# Patient Record
Sex: Male | Born: 1960 | Race: White | Hispanic: No | Marital: Single | Smoking: Never smoker
Health system: Southern US, Community
[De-identification: ages and names within clinical notes are randomized; demographics above are authoritative.]

## PROBLEM LIST (undated history)

## (undated) DIAGNOSIS — M1990 Unspecified osteoarthritis, unspecified site: Secondary | ICD-10-CM

## (undated) DIAGNOSIS — I2699 Other pulmonary embolism without acute cor pulmonale: Secondary | ICD-10-CM

## (undated) DIAGNOSIS — I82409 Acute embolism and thrombosis of unspecified deep veins of unspecified lower extremity: Secondary | ICD-10-CM

## (undated) DIAGNOSIS — J189 Pneumonia, unspecified organism: Secondary | ICD-10-CM

## (undated) HISTORY — PX: CERVICAL SPINE SURGERY: SHX589

## (undated) HISTORY — PX: BACK SURGERY: SHX140

## (undated) HISTORY — PX: KNEE SURGERY: SHX244

## (undated) HISTORY — PX: APPENDECTOMY: SHX54

---

## 2013-10-04 ENCOUNTER — Emergency Department (HOSPITAL_BASED_OUTPATIENT_CLINIC_OR_DEPARTMENT_OTHER)
Admission: EM | Admit: 2013-10-04 | Discharge: 2013-10-04 | Disposition: A | Discharge status: Home / Self Care | Payer: Medicare Other | Attending: Emergency Medicine | Admitting: Emergency Medicine

## 2013-10-04 ENCOUNTER — Encounter (HOSPITAL_BASED_OUTPATIENT_CLINIC_OR_DEPARTMENT_OTHER): Payer: Self-pay | Admitting: Emergency Medicine

## 2013-10-04 DIAGNOSIS — S81851A Open bite, right lower leg, initial encounter: Secondary | ICD-10-CM

## 2013-10-04 DIAGNOSIS — Z23 Encounter for immunization: Secondary | ICD-10-CM | POA: Diagnosis not present

## 2013-10-04 DIAGNOSIS — M129 Arthropathy, unspecified: Secondary | ICD-10-CM | POA: Diagnosis not present

## 2013-10-04 DIAGNOSIS — S81809A Unspecified open wound, unspecified lower leg, initial encounter: Principal | ICD-10-CM

## 2013-10-04 DIAGNOSIS — W540XXA Bitten by dog, initial encounter: Secondary | ICD-10-CM | POA: Insufficient documentation

## 2013-10-04 DIAGNOSIS — S81009A Unspecified open wound, unspecified knee, initial encounter: Secondary | ICD-10-CM | POA: Diagnosis present

## 2013-10-04 DIAGNOSIS — Y9389 Activity, other specified: Secondary | ICD-10-CM | POA: Insufficient documentation

## 2013-10-04 DIAGNOSIS — Z79899 Other long term (current) drug therapy: Secondary | ICD-10-CM | POA: Insufficient documentation

## 2013-10-04 DIAGNOSIS — Y9289 Other specified places as the place of occurrence of the external cause: Secondary | ICD-10-CM | POA: Insufficient documentation

## 2013-10-04 DIAGNOSIS — S91009A Unspecified open wound, unspecified ankle, initial encounter: Principal | ICD-10-CM

## 2013-10-04 HISTORY — DX: Unspecified osteoarthritis, unspecified site: M19.90

## 2013-10-04 MED ORDER — AMOXICILLIN-POT CLAVULANATE 875-125 MG PO TABS
1.0000 | ORAL_TABLET | Freq: Once | ORAL | Status: AC
  Administered 2013-10-04: 1 via ORAL
  Filled 2013-10-04: qty 1

## 2013-10-04 MED ORDER — AMOXICILLIN-POT CLAVULANATE 875-125 MG PO TABS: 1.0000 | ORAL_TABLET | Freq: Two times a day (BID) | ORAL | Status: DC

## 2013-10-04 MED ORDER — TETANUS-DIPHTH-ACELL PERTUSSIS 5-2.5-18.5 LF-MCG/0.5 IM SUSP
0.5000 mL | Freq: Once | INTRAMUSCULAR | Status: AC
  Administered 2013-10-04: 0.5 mL via INTRAMUSCULAR
  Filled 2013-10-04: qty 0.5

## 2013-10-04 NOTE — Discharge Instructions (Signed)

## 2013-10-04 NOTE — ED Notes (Signed)
Animal bite to his right upper leg by his sisters dog. Rabies vaccine is UTD. Puncture wound. Pt states he needs a DT.

## 2013-10-04 NOTE — ED Provider Notes (Signed)
CSN: [id number]     Arrival date & time 10/04/13  4098 History   First MD Initiated Contact with Patient 10/04/13 1951     Chief Complaint  Patient presents with  . Animal Bite     (Consider location/radiation/quality/duration/timing/severity/associated sxs/prior Treatment) Patient is a 53 y.o. male presenting with animal bite. The history is provided by the patient. No language interpreter was used.  Animal Bite Contact animal:  Dog Location:  Leg Leg injury location:  R leg Time since incident:  1 hour Pain details:    Quality:  Aching   Severity:  No pain Incident location:  Another residence Provoked: unprovoked   Notifications:  None Animal's rabies vaccination status:  Up to date Animal in possession: yes   Tetanus status:  Out of date Worsened by:  Nothing tried Ineffective treatments:  None tried playing with dog, hit dogs mouth  Past Medical History  Diagnosis Date  . Arthritis    No past surgical history on file. No family history on file. History  Substance Use Topics  . Smoking status: Not on file  . Smokeless tobacco: Not on file  . Alcohol Use: No    Review of Systems  All other systems reviewed and are negative.     Allergies  Review of patient's allergies indicates not on file.  Home Medications   Prior to Admission medications   Medication Sig Start Date End Date Taking? Authorizing Provider  Adalimumab (HUMIRA PEN [place]) Inject into the skin.   Yes Historical Provider, MD  MORPHINE SULFATE PO Take by mouth.   Yes Historical Provider, MD   BP 116/78  Pulse 76  Temp(Src) 98 F (36.7 C) (Oral)  Resp 20  Ht  (1.88 m)  Wt 190 lb (86.183 kg)  BMI 24.38 kg/m2  SpO2 99% Physical Exam  Constitutional: He appears well-developed and well-nourished.  Musculoskeletal: He exhibits tenderness.  Neurological: He is alert.  Skin: Skin is warm.  1cm abrasion superficial laceration  nv intact  Psychiatric: He has a normal mood and affect.     ED Course  Procedures (including critical care time) Labs Review Labs Reviewed - No data to display  Imaging Review No results found.   EKG Interpretation None      MDM Pt needs td.   Pt is on humira.   I counseled on infection and close monitoring   Final diagnoses:  Dog bite of right lower leg, initial encounter    Td Aigmentin Pt reports Mother is RN and will help him look for infection    Elson Areas, PA-C 10/04/13 2002

## 2013-10-04 NOTE — ED Provider Notes (Signed)
Medical screening examination/treatment/procedure(s) were performed by non-physician practitioner and as supervising physician I was immediately available for consultation/collaboration.   EKG Interpretation None        Sohum Delillo, MD 10/04/13 2256 

## 2019-10-14 ENCOUNTER — Emergency Department (HOSPITAL_BASED_OUTPATIENT_CLINIC_OR_DEPARTMENT_OTHER)
Admission: EM | Admit: 2019-10-14 | Discharge: 2019-10-14 | Disposition: A | Discharge status: Home / Self Care | Payer: Medicare HMO | Attending: Emergency Medicine | Admitting: Emergency Medicine

## 2019-10-14 ENCOUNTER — Encounter (HOSPITAL_BASED_OUTPATIENT_CLINIC_OR_DEPARTMENT_OTHER): Payer: Self-pay

## 2019-10-14 ENCOUNTER — Telehealth (HOSPITAL_BASED_OUTPATIENT_CLINIC_OR_DEPARTMENT_OTHER): Payer: Self-pay | Admitting: Emergency Medicine

## 2019-10-14 ENCOUNTER — Other Ambulatory Visit: Payer: Self-pay

## 2019-10-14 ENCOUNTER — Emergency Department (HOSPITAL_BASED_OUTPATIENT_CLINIC_OR_DEPARTMENT_OTHER): Payer: Medicare HMO

## 2019-10-14 DIAGNOSIS — J0140 Acute pansinusitis, unspecified: Secondary | ICD-10-CM | POA: Insufficient documentation

## 2019-10-14 DIAGNOSIS — Z20822 Contact with and (suspected) exposure to covid-19: Secondary | ICD-10-CM | POA: Diagnosis not present

## 2019-10-14 DIAGNOSIS — R0981 Nasal congestion: Secondary | ICD-10-CM | POA: Insufficient documentation

## 2019-10-14 DIAGNOSIS — R05 Cough: Secondary | ICD-10-CM | POA: Diagnosis present

## 2019-10-14 HISTORY — DX: Other pulmonary embolism without acute cor pulmonale: I26.99

## 2019-10-14 HISTORY — DX: Pneumonia, unspecified organism: J18.9

## 2019-10-14 HISTORY — DX: Acute embolism and thrombosis of unspecified deep veins of unspecified lower extremity: I82.409

## 2019-10-14 LAB — SARS CORONAVIRUS 2 BY RT PCR (HOSPITAL ORDER, PERFORMED IN ~~LOC~~ HOSPITAL LAB): SARS Coronavirus 2: NEGATIVE

## 2019-10-14 MED ORDER — AMOXICILLIN-POT CLAVULANATE 875-125 MG PO TABS: 1.0000 | ORAL_TABLET | Freq: Two times a day (BID) | ORAL | 0 refills | Status: DC

## 2019-10-14 MED ORDER — AMOXICILLIN-POT CLAVULANATE 875-125 MG PO TABS: 1.0000 | ORAL_TABLET | Freq: Two times a day (BID) | ORAL | 0 refills | Status: AC

## 2019-10-14 NOTE — ED Provider Notes (Signed)
[facility] EMERGENCY DEPARTMENT Provider Note   CSN: [id number] Arrival date & time: 10/14/19  1121     History Chief Complaint  Patient presents with   Cough    Kyle Rice is a 59 y.o. male with pertinent past medical history of arthritis on Humira that presents to the emergency department today for facial tenderness, cough that started 6 days ago.  Patient states that he was tested for Covid with PCP, was negative.  States that he thinks it started with his allergies, however it has been worsening.  States that there was a few days where he got better, now is way worse than when it started.  Also admits to low-grade fevers at night, 100.1 at the highest.  Also admits to congestion.  States that cough is purulent, green mucus.  No hemoptysis.  States that he feels like most of his infection is in his face.  No shortness of breath or chest pain, no difficulty breathing.  Denies any congestion, red eyes, ear pain, otorrhea, rash, neck pain, headache.  Has not been taking anything for this.  States that he has been generally healthy, staying away from people and wearing his mask at all times.  States that he did have his Covid vaccines in March.  Has not been around anyone sick.  Has been able to eat and drink okay.  No nausea, vomiting, diarrhea, anosmia.  No myalgias.  States that he normally does get sinus infections since he started taking his Humira.  HPI     Past Medical History:  Diagnosis Date   Arthritis    DVT (deep venous thrombosis) (HCC)    Pneumonia    Pulmonary emboli (HCC)     There are no problems to display for this patient.   Past Surgical History:  Procedure Laterality Date   APPENDECTOMY     BACK SURGERY     CERVICAL SPINE SURGERY     KNEE SURGERY         No family history on file.  Social History   Tobacco Use   Smoking status: Never Smoker   Smokeless tobacco: Never Used  Vaping Use   Vaping Use: Never used  Substance Use  Topics   Alcohol use: Yes    Comment: occ   Drug use: No    Home Medications Prior to Admission medications   Medication Sig Start Date End Date Taking? Authorizing Provider  Adalimumab (HUMIRA PEN [place]) Inject into the skin.    [provider]  amoxicillin-clavulanate (AUGMENTIN) 875-125 MG tablet Take 1 tablet by mouth every 12 (twelve) hours. 10/14/19   Farrel Gordon, PA-C  MORPHINE SULFATE PO Take by mouth.    [provider]    Allergies    Patient has no known allergies.  Review of Systems   Review of Systems  Constitutional: Negative for diaphoresis, fatigue and fever.  HENT: Positive for congestion, sinus pressure and sinus pain. Negative for drooling, ear pain, facial swelling, hearing loss, mouth sores, nosebleeds, postnasal drip, rhinorrhea, sneezing, sore throat, tinnitus, trouble swallowing and voice change.   Eyes: Negative for pain, redness and visual disturbance.  Respiratory: Positive for cough. Negative for shortness of breath.   Cardiovascular: Negative for chest pain.  Gastrointestinal: Negative for nausea and vomiting.  Musculoskeletal: Negative for back pain and myalgias.  Skin: Negative for color change, pallor, rash and wound.  Neurological: Negative for syncope, weakness, light-headedness, numbness and headaches.  Psychiatric/Behavioral: Negative for behavioral problems and confusion.  Physical Exam Updated Vital Signs BP 108/84 (BP Location: Right Arm)    Pulse 73    Temp 98.1 F (36.7 C) (Oral)    Resp 20    Ht 5\' 11"  (1.803 m)    Wt 90.7 kg    SpO2 95%    BMI 27.89 kg/m   Physical Exam Constitutional:      General: He is not in acute distress.    Appearance: Normal appearance. He is not ill-appearing, toxic-appearing or diaphoretic.     Comments: Patient without acute respiratory stress.  Patient is sitting comfortably in bed, no tripoding, use of accessory muscles.  Patient is speaking to me in full sentences.  Handling  secretions well.  HENT:     Head: Normocephalic and atraumatic.     Jaw: There is normal jaw occlusion. No trismus, swelling or malocclusion.     Nose: No congestion or rhinorrhea.     Right Sinus: Maxillary sinus tenderness and frontal sinus tenderness present.     Left Sinus: Maxillary sinus tenderness and frontal sinus tenderness present.     Mouth/Throat:     Mouth: Mucous membranes are moist. No oral lesions.     Dentition: Normal dentition.     Tongue: No lesions.     Palate: No mass and lesions.     Pharynx: Oropharynx is clear. Uvula midline. No pharyngeal swelling, oropharyngeal exudate, posterior oropharyngeal erythema or uvula swelling.     Tonsils: No tonsillar exudate or tonsillar abscesses. 1+ on the right. 1+ on the left.     Comments: Patient without tonsillar enlargement or exudate.  No signs of peritonsillar abscess, palate without any tenderness or masses palpated.  No swelling under the tongue, uvula is midline without any inflammation. Eyes:     General: No visual field deficit or scleral icterus.       Right eye: No discharge.        Left eye: No discharge.     Extraocular Movements: Extraocular movements intact.     Conjunctiva/sclera: Conjunctivae normal.     Pupils: Pupils are equal, round, and reactive to light.  Cardiovascular:     Rate and Rhythm: Normal rate and regular rhythm.     Pulses: Normal pulses.     Heart sounds: Normal heart sounds. No murmur heard.  No friction rub. No gallop.   Pulmonary:     Effort: Pulmonary effort is normal. No respiratory distress.     Breath sounds: Normal breath sounds. No stridor. No wheezing, rhonchi or rales.  Chest:     Chest wall: No tenderness.  Abdominal:     General: Abdomen is flat. Bowel sounds are normal. There is no distension.     Palpations: Abdomen is soft.     Tenderness: There is no abdominal tenderness. There is no right CVA tenderness, left CVA tenderness, guarding or rebound.  Musculoskeletal:         General: No swelling or tenderness. Normal range of motion.     Cervical back: Normal range of motion and neck supple. No rigidity or tenderness.     Right lower leg: No edema.     Left lower leg: No edema.  Lymphadenopathy:     Cervical: No cervical adenopathy.  Skin:    General: Skin is warm and dry.     Capillary Refill: Capillary refill takes less than 2 seconds.     Coloration: Skin is not pale.     Findings: No erythema or rash.  Neurological:  General: No focal deficit present.     Mental Status: He is alert and oriented to person, place, and time.     Cranial Nerves: Cranial nerves are intact. No cranial nerve deficit or facial asymmetry.     Sensory: No sensory deficit.     Motor: Motor function is intact. No weakness.     Coordination: Coordination is intact.     Gait: Gait is intact. Gait normal.  Psychiatric:        Mood and Affect: Mood normal.        Behavior: Behavior normal.     ED Results / Procedures / Treatments   Labs (all labs ordered are listed, but only abnormal results are displayed) Labs Reviewed  SARS CORONAVIRUS 2 BY RT PCR (HOSPITAL ORDER, PERFORMED IN [facility])    EKG None  Radiology DG Chest Portable 1 View  Result Date: 10/14/2019 CLINICAL DATA:  Productive cough, fever. EXAM: PORTABLE CHEST 1 VIEW COMPARISON:  April 05, 2018. FINDINGS: The heart size and mediastinal contours are within normal limits. Both lungs are clear. The visualized skeletal structures are unremarkable. IMPRESSION: No active disease. Electronically Signed   By: Lupita Raider M.D.   On: 10/14/2019 12:16    Procedures Procedures (including critical care time)  Medications Ordered in ED Medications - No data to display  ED Course  I have reviewed the triage vital signs and the nursing notes.  Pertinent labs & imaging results that were available during my care of the patient were reviewed by me and considered in my medical decision making  (see chart for details).    MDM Rules/Calculators/A&P                         Kyle Rice is a 59 y.o. male with pertinent past medical history of arthritis on Humira that presents to the emergency department today for facial tenderness, cough that started 6 days ago.  Patient with physical exam suggestive of acute sinusitis, will prescribe antibiotics at this time due to double worsening and immunocompromise state with Humira.  Patient to follow-up with PCP, states that he will tomorrow.  Chest x-ray negative for acute cardiopulmonary disease, interpreted by me.  Patient states that he generally healthy, normal vitals.  Do not think that we need labs at this time.  Covid test in process, told patient that he did not need to wait for this and if this was positive that he needs to follow CDC guidelines.  However I do not suspect that this is positive.  Patient to be discharged.   Doubt need for further emergent work up at this time. I explained the diagnosis and have given explicit precautions to return to the ER including for any other new or worsening symptoms. The patient understands and accepts the medical plan as it's been dictated and I have answered their questions. Discharge instructions concerning home care and prescriptions have been given. The patient is STABLE and is discharged to home in good condition.   Final Clinical Impression(s) / ED Diagnoses Final diagnoses:  Acute non-recurrent pansinusitis    Rx / DC Orders ED Discharge Orders         Ordered    amoxicillin-clavulanate (AUGMENTIN) 875-125 MG tablet  Every 12 hours        10/14/19 1327           Farrel Gordon, PA-C 10/14/19 1335    Linwood Dibbles, MD 10/15/19 [phone]

## 2019-10-14 NOTE — ED Triage Notes (Addendum)
Pt c/o flu like sx started 9/1-had covid test 9/2 at [facility] neg results-NAD-steady gait-pt agreeable to CXR and to wait on possible covid test due to recent neg test

## 2019-10-14 NOTE — Telephone Encounter (Signed)
Patient requesting prescription changed to different phamacy, I changed prescription to different pharmacy.

## 2019-10-14 NOTE — Discharge Instructions (Signed)
You were seen today for sinusitis, please use the attached instructions.  As we discussed sinusitis could be viral, however I want you to take these antibiotics in case this is bacterial since your symptoms are becoming worse now and you are prone to sinus infections.  Please come back to the emergency department for any new or worsening concerning symptoms including difficulty breathing.  Please follow-up with your primary care in the next couple of days.  Your Covid test is pending, if this is positive as we discussed you need to follow CDC guidelines and most likely stop taking your antibiotic.  Please talk to your PCP about this. I hope you feel better!

## 2021-01-20 IMAGING — DX DG CHEST 1V PORT
1 series · 1 of 1 positions shown · non-contrast
Comparison: April 05, 2018.

CLINICAL DATA: Productive cough, fever.

EXAM:
PORTABLE CHEST 1 VIEW

[chest ap]
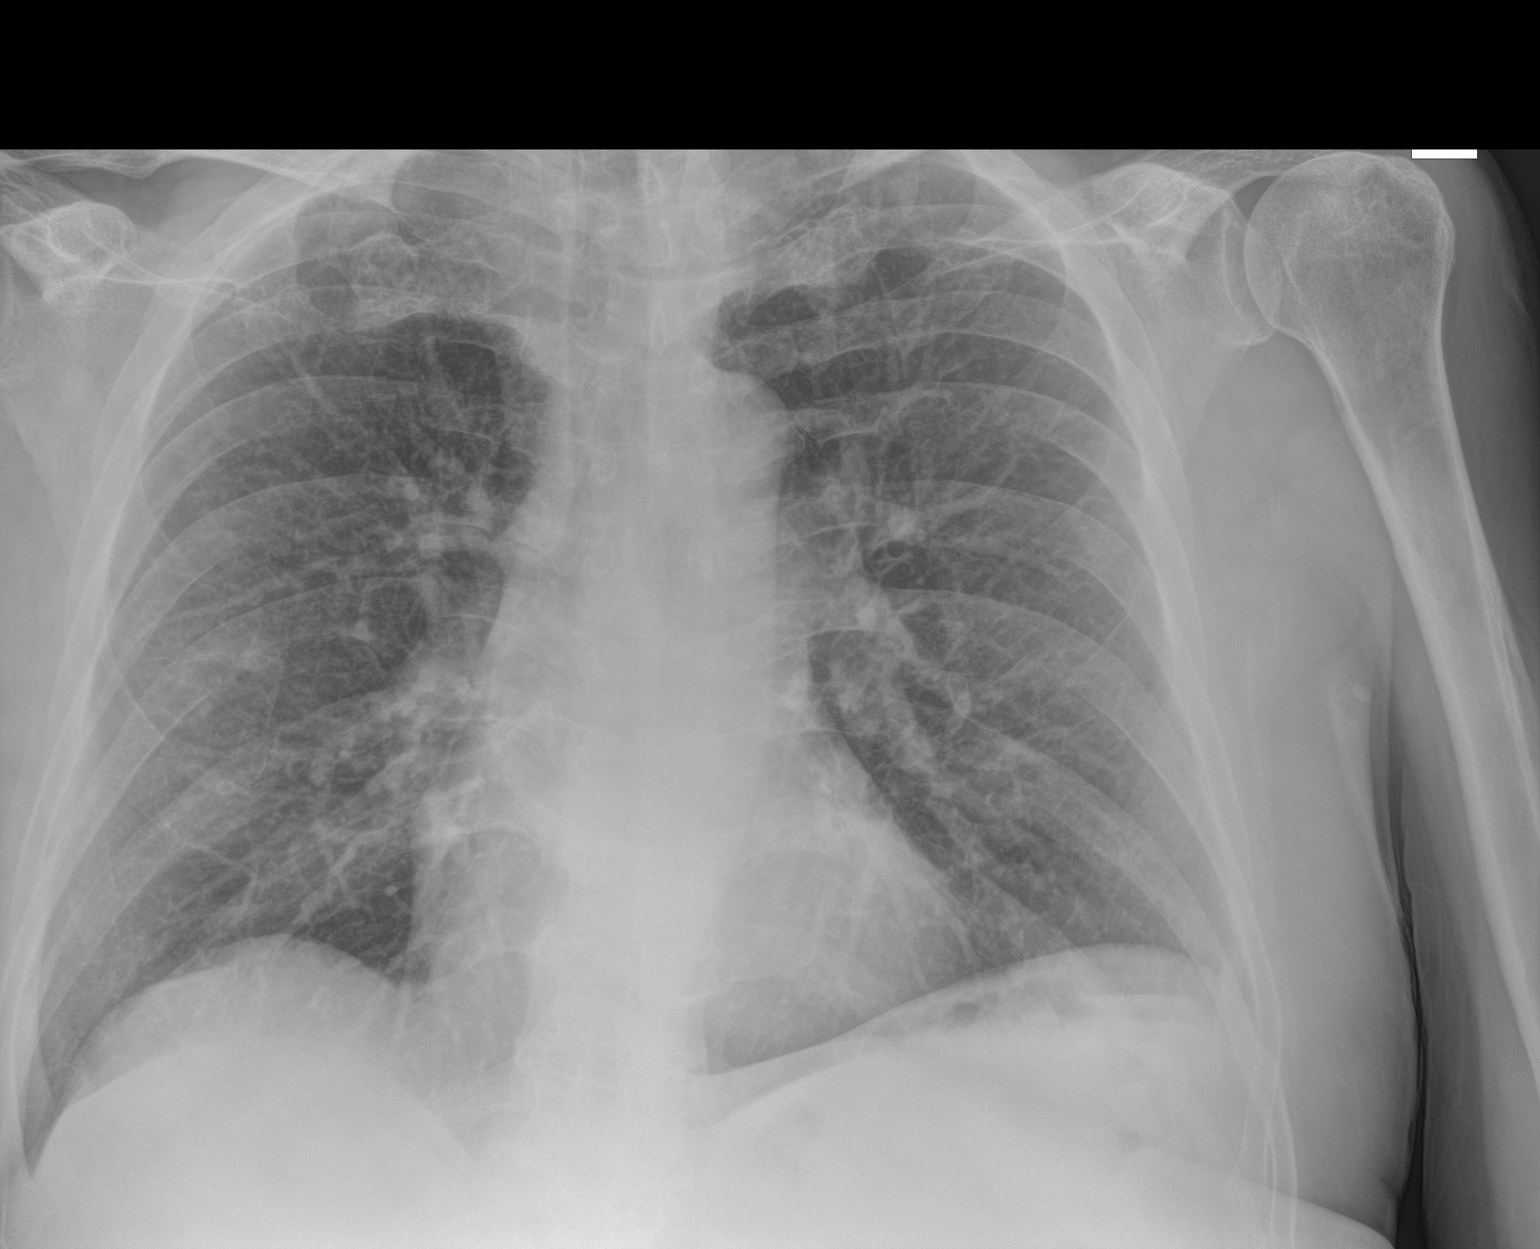

[1 of 1 positions shown; findings below may reference images not displayed]

FINDINGS: The heart size and mediastinal contours are within normal limits.
Both lungs are clear. The visualized skeletal structures are
unremarkable.
IMPRESSION: No active disease.

## 2021-08-02 ENCOUNTER — Emergency Department (HOSPITAL_BASED_OUTPATIENT_CLINIC_OR_DEPARTMENT_OTHER)
Admission: EM | Admit: 2021-08-02 | Discharge: 2021-08-02 | Disposition: A | Discharge status: Home / Self Care | Payer: Medicare HMO | Attending: Emergency Medicine | Admitting: Emergency Medicine

## 2021-08-02 ENCOUNTER — Encounter (HOSPITAL_BASED_OUTPATIENT_CLINIC_OR_DEPARTMENT_OTHER): Payer: Self-pay | Admitting: Emergency Medicine

## 2021-08-02 ENCOUNTER — Other Ambulatory Visit: Payer: Self-pay

## 2021-08-02 DIAGNOSIS — G8929 Other chronic pain: Secondary | ICD-10-CM | POA: Diagnosis not present

## 2021-08-02 DIAGNOSIS — R531 Weakness: Secondary | ICD-10-CM | POA: Insufficient documentation

## 2021-08-02 DIAGNOSIS — M545 Low back pain, unspecified: Secondary | ICD-10-CM | POA: Diagnosis present

## 2021-08-02 DIAGNOSIS — M5441 Lumbago with sciatica, right side: Secondary | ICD-10-CM | POA: Insufficient documentation

## 2021-08-02 DIAGNOSIS — R600 Localized edema: Secondary | ICD-10-CM | POA: Insufficient documentation

## 2021-08-02 MED ORDER — KETOROLAC TROMETHAMINE 30 MG/ML IJ SOLN
30.0000 mg | Freq: Once | INTRAMUSCULAR | Status: AC
  Administered 2021-08-02: 30 mg via INTRAMUSCULAR
  Filled 2021-08-02: qty 1

## 2021-08-02 MED ORDER — CYCLOBENZAPRINE HCL 10 MG PO TABS
10.0000 mg | ORAL_TABLET | Freq: Once | ORAL | Status: AC
  Administered 2021-08-02: 10 mg via ORAL
  Filled 2021-08-02: qty 1

## 2021-08-02 MED ORDER — ONDANSETRON 4 MG PO TBDP
4.0000 mg | ORAL_TABLET | Freq: Once | ORAL | Status: AC
Start: 2021-08-02 — End: 2021-08-02
  Administered 2021-08-02: 4 mg via ORAL
  Filled 2021-08-02: qty 1

## 2021-08-02 MED ORDER — HYDROMORPHONE HCL 1 MG/ML IJ SOLN
2.0000 mg | Freq: Once | INTRAMUSCULAR | Status: AC
  Administered 2021-08-02: 2 mg via INTRAMUSCULAR
  Filled 2021-08-02: qty 2

## 2021-08-02 NOTE — ED Triage Notes (Signed)
Pt in with "excruciating sacral and ischial back pain", hx of 21 back surgeries, arthritis. Reports bilateral leg weakening/numbness x 3 days. Took 1 Percocet 1 hr PTA

## 2021-08-08 ENCOUNTER — Emergency Department (HOSPITAL_BASED_OUTPATIENT_CLINIC_OR_DEPARTMENT_OTHER): Payer: Medicare HMO

## 2021-08-08 ENCOUNTER — Other Ambulatory Visit: Payer: Self-pay

## 2021-08-08 ENCOUNTER — Encounter (HOSPITAL_BASED_OUTPATIENT_CLINIC_OR_DEPARTMENT_OTHER): Payer: Self-pay | Admitting: Emergency Medicine

## 2021-08-08 ENCOUNTER — Emergency Department (HOSPITAL_BASED_OUTPATIENT_CLINIC_OR_DEPARTMENT_OTHER)
Admission: EM | Admit: 2021-08-08 | Discharge: 2021-08-08 | Disposition: A | Discharge status: Home / Self Care | Payer: Medicare HMO | Attending: Emergency Medicine | Admitting: Emergency Medicine

## 2021-08-08 DIAGNOSIS — R509 Fever, unspecified: Secondary | ICD-10-CM | POA: Diagnosis not present

## 2021-08-08 DIAGNOSIS — L03317 Cellulitis of buttock: Secondary | ICD-10-CM

## 2021-08-08 LAB — COMPREHENSIVE METABOLIC PANEL
ALT: 20 U/L (ref 0–44)
AST: 28 U/L (ref 15–41)
Albumin: 3 g/dL — ABNORMAL LOW (ref 3.5–5.0)
Alkaline Phosphatase: 104 U/L (ref 38–126)
Anion gap: 9 (ref 5–15)
BUN: 11 mg/dL (ref 6–20)
CO2: 25 mmol/L (ref 22–32)
Calcium: 8.3 mg/dL — ABNORMAL LOW (ref 8.9–10.3)
Chloride: 107 mmol/L (ref 98–111)
Creatinine, Ser: 0.6 mg/dL — ABNORMAL LOW (ref 0.61–1.24)
GFR, Estimated: >60 mL/min (ref >60)
Glucose, Bld: 156 mg/dL — ABNORMAL HIGH (ref 70–99)
Potassium: 3.5 mmol/L (ref 3.5–5.1)
Sodium: 141 mmol/L (ref 135–145)
Total Bilirubin: 0.6 mg/dL (ref 0.3–1.2)
Total Protein: 6.6 g/dL (ref 6.5–8.1)

## 2021-08-08 LAB — URINALYSIS, MICROSCOPIC (REFLEX)

## 2021-08-08 LAB — CBC WITH DIFFERENTIAL/PLATELET
Abs Immature Granulocytes: 0.2 10*3/uL — ABNORMAL HIGH (ref 0.00–0.07)
Basophils Absolute: 0.1 10*3/uL (ref 0.0–0.1)
Basophils Relative: 0 %
Eosinophils Absolute: 0 10*3/uL (ref 0.0–0.5)
Eosinophils Relative: 0 %
HCT: 35.5 % — ABNORMAL LOW (ref 39.0–52.0)
Hemoglobin: 11.3 g/dL — ABNORMAL LOW (ref 13.0–17.0)
Immature Granulocytes: 1 %
Lymphocytes Relative: 2 %
Lymphs Abs: 0.4 10*3/uL — ABNORMAL LOW (ref 0.7–4.0)
MCH: 28.3 pg (ref 26.0–34.0)
MCHC: 31.8 g/dL (ref 30.0–36.0)
MCV: 88.8 fL (ref 80.0–100.0)
Monocytes Absolute: 1 10*3/uL (ref 0.1–1.0)
Monocytes Relative: 5 %
Neutro Abs: 18.8 10*3/uL — ABNORMAL HIGH (ref 1.7–7.7)
Neutrophils Relative %: 92 %
Platelets: 230 10*3/uL (ref 150–400)
RBC: 4 MIL/uL — ABNORMAL LOW (ref 4.22–5.81)
RDW: 21.1 % — ABNORMAL HIGH (ref 11.5–15.5)
Smear Review: NORMAL
WBC: 20.5 10*3/uL — ABNORMAL HIGH (ref 4.0–10.5)
nRBC: 0 % (ref 0.0–0.2)

## 2021-08-08 LAB — URINALYSIS, ROUTINE W REFLEX MICROSCOPIC
Glucose, UA: NEGATIVE mg/dL
Hgb urine dipstick: NEGATIVE
Ketones, ur: NEGATIVE mg/dL
Leukocytes,Ua: NEGATIVE
Nitrite: NEGATIVE
Protein, ur: 100 mg/dL — AB
Specific Gravity, Urine: >=1.03 (ref 1.005–1.030)
pH: 5.5 (ref 5.0–8.0)

## 2021-08-08 LAB — LACTIC ACID, PLASMA: Lactic Acid, Venous: 1.3 mmol/L (ref 0.5–1.9)

## 2021-08-08 MED ORDER — OXYCODONE-ACETAMINOPHEN 5-325 MG PO TABS
1.0000 | ORAL_TABLET | Freq: Once | ORAL | Status: AC
  Administered 2021-08-08: 1 via ORAL
  Filled 2021-08-08: qty 1

## 2021-08-08 MED ORDER — SODIUM CHLORIDE 0.9 % IV SOLN
2.0000 g | INTRAVENOUS | Status: DC
  Administered 2021-08-08: 2 g via INTRAVENOUS
  Filled 2021-08-08: qty 20

## 2021-08-08 MED ORDER — SODIUM CHLORIDE 0.9 % IV SOLN: INTRAVENOUS | Status: DC | PRN

## 2021-08-08 MED ORDER — CEPHALEXIN 500 MG PO CAPS: 500.0000 mg | ORAL_CAPSULE | Freq: Four times a day (QID) | ORAL | 0 refills | Status: DC

## 2021-08-08 MED ORDER — IOHEXOL 300 MG/ML  SOLN
100.0000 mL | Freq: Once | INTRAMUSCULAR | Status: AC | PRN
  Administered 2021-08-08: 100 mL via INTRAVENOUS

## 2021-08-08 MED ORDER — SULFAMETHOXAZOLE-TRIMETHOPRIM 800-160 MG PO TABS: 1.0000 | ORAL_TABLET | Freq: Two times a day (BID) | ORAL | 0 refills | Status: AC

## 2021-08-08 MED ORDER — SODIUM CHLORIDE 0.9 % IV BOLUS
1000.0000 mL | Freq: Once | INTRAVENOUS | Status: AC
  Administered 2021-08-08: 1000 mL via INTRAVENOUS

## 2021-08-08 MED ORDER — OXYCODONE-ACETAMINOPHEN 5-325 MG PO TABS
2.0000 | ORAL_TABLET | Freq: Once | ORAL | Status: AC
  Administered 2021-08-08: 2 via ORAL
  Filled 2021-08-08: qty 2

## 2021-08-08 MED ORDER — VANCOMYCIN HCL IN DEXTROSE 1-5 GM/200ML-% IV SOLN
1000.0000 mg | Freq: Once | INTRAVENOUS | Status: AC
  Administered 2021-08-08: 1000 mg via INTRAVENOUS
  Filled 2021-08-08: qty 200

## 2021-08-08 NOTE — ED Provider Notes (Signed)
[facility] EMERGENCY DEPARTMENT Provider Note   CSN: [id number] Arrival date & time: 08/08/21  1303     History  Chief Complaint  Patient presents with   Abscess    Kyle Rice is a 61 y.o. male.  Kyle Rice is a 61 y.o. male with hx of PE, DVT, pneumonia and rheumatoid arthritis, who presents to the ED with concern for wound or skin infection to the right buttock.  He reports he noticed an area of redness about 4 days ago but it has quickly doubled in size, it is sore and mildly painful, but he is concerned that the area is getting larger and tracking forward.  He denies any rectal pain or pain with defecation.  Reports he had a low-grade temp of 99.5 last night.  He has had a small amount of purulent drainage from the area.  Reports history of some more minor wounds and skin infections but nothing this large.  He has not been on any antibiotics recently.  No nausea or vomiting.  No pain in the testicles or scrotum.  No abdominal pain.  Patient does have chronic back pain related to scoliosis which limits his mobility and he thinks that that is why he likely got this infection in the first place because he is fairly sedentary and sits with a lot of pressure on his right side.  No other aggravating or alleviating factors.  Patient is on immunologic therapy as well as chronic steroids for his rheumatoid arthritis.   Abscess Associated symptoms: fever   Associated symptoms: no nausea and no vomiting        Home Medications Prior to Admission medications   Medication Sig Start Date End Date Taking? Authorizing Provider  cephALEXin (KEFLEX) 500 MG capsule Take 1 capsule (500 mg total) by mouth 4 (four) times daily. 08/08/21  Yes Dartha Lodge, PA-C  sulfamethoxazole-trimethoprim (BACTRIM DS) 800-160 MG tablet Take 1 tablet by mouth 2 (two) times daily for 7 days. 08/08/21 08/15/21 Yes Dewaun Kinzler, Arva Chafe, PA-C  Adalimumab (HUMIRA PEN Dixon) Inject into the skin.    [provider]  MORPHINE SULFATE PO Take by mouth.    [provider]      Allergies    Patient has no known allergies.    Review of Systems   Review of Systems  Constitutional:  Positive for chills and fever.  HENT: Negative.    Respiratory:  Negative for cough and shortness of breath.   Cardiovascular:  Negative for chest pain.  Gastrointestinal:  Positive for abdominal pain. Negative for nausea and vomiting.  Genitourinary:  Negative for dysuria and frequency.  Skin:  Positive for color change.    Physical Exam Updated Vital Signs BP 133/84   Pulse 98   Temp 98.6 F (37 C) (Oral)   Resp 17   Ht 5\' 11"  (1.803 m)   Wt 78.5 kg   SpO2 95%   BMI 24.13 kg/m  Physical Exam Vitals and nursing note reviewed.  Constitutional:      General: He is not in acute distress.    Appearance: Normal appearance. He is well-developed. He is ill-appearing. He is not diaphoretic.     Comments: Alert, chronically ill-appearing, appears uncomfortable but no acute distress  HENT:     Head: Normocephalic and atraumatic.     Mouth/Throat:     Mouth: Mucous membranes are moist.     Pharynx: Oropharynx is clear.  Eyes:     General:  Right eye: No discharge.        Left eye: No discharge.  Cardiovascular:     Rate and Rhythm: Regular rhythm. Tachycardia present.  Pulmonary:     Effort: Pulmonary effort is normal. No respiratory distress.     Breath sounds: Normal breath sounds.     Comments: Respirations equal and unlabored, patient able to speak in full sentences, lungs clear to auscultation bilaterally  Abdominal:     General: Bowel sounds are normal. There is no distension.     Palpations: Abdomen is soft. There is no mass.     Tenderness: There is no abdominal tenderness. There is no guarding.     Comments: Abdomen soft, nondistended, nontender to palpation in all quadrants without guarding or peritoneal signs  Genitourinary:    Comments: Chaperone present during exam. Area of  redness and induration extending from the lower right buttock towards the perineum, no fluctuance or expressible drainage.  There is no erythema or induration over the scrotum or tenderness over the testicles.  No perirectal tenderness. Musculoskeletal:     Cervical back: Neck supple.  Skin:    General: Skin is warm and dry.  Neurological:     Mental Status: He is alert and oriented to person, place, and time.     Coordination: Coordination normal.  Psychiatric:        Mood and Affect: Mood normal.        Behavior: Behavior normal.     ED Results / Procedures / Treatments   Labs (all labs ordered are listed, but only abnormal results are displayed) Labs Reviewed  COMPREHENSIVE METABOLIC PANEL - Abnormal; Notable for the following components:      Result Value   Glucose, Bld 156 (*)    Creatinine, Ser 0.60 (*)    Calcium 8.3 (*)    Albumin 3.0 (*)    All other components within normal limits  CBC WITH DIFFERENTIAL/PLATELET - Abnormal; Notable for the following components:   WBC 20.5 (*)    RBC 4.00 (*)    Hemoglobin 11.3 (*)    HCT 35.5 (*)    RDW 21.1 (*)    Neutro Abs 18.8 (*)    Lymphs Abs 0.4 (*)    Abs Immature Granulocytes 0.20 (*)    All other components within normal limits  URINALYSIS, ROUTINE W REFLEX MICROSCOPIC - Abnormal; Notable for the following components:   Bilirubin Urine SMALL (*)    Protein, ur 100 (*)    All other components within normal limits  URINALYSIS, MICROSCOPIC (REFLEX) - Abnormal; Notable for the following components:   Bacteria, UA FEW (*)    All other components within normal limits  CULTURE, BLOOD (ROUTINE X 2)  CULTURE, BLOOD (ROUTINE X 2)  CULTURE, BLOOD (SINGLE)  LACTIC ACID, PLASMA    EKG None  Radiology CT PELVIS W CONTRAST  Result Date: 08/08/2021 CLINICAL DATA:  Pressure wound over the right hip. EXAM: CT PELVIS WITH CONTRAST TECHNIQUE: Multidetector CT imaging of the pelvis was performed using the standard protocol  following the bolus administration of intravenous contrast. RADIATION DOSE REDUCTION: This exam was performed according to the departmental dose-optimization program which includes automated exposure control, adjustment of the mA and/or kV according to patient size and/or use of iterative reconstruction technique. CONTRAST:  OMNIPAQUE IOHEXOL 300 MG/ML  SOLN COMPARISON:  04/11/2017. FINDINGS: Urinary Tract:  No abnormality visualized. Bowel: Visualized bowel is normal in caliber. No wall thickening. No inflammation. Vascular/Lymphatic: Aortoiliac atherosclerotic calcifications. No  aneurysm. No enlarged lymph nodes. Reproductive:  Unremarkable. Other: There is focal opacity extending from the skin in the subcutaneous soft tissues of the left posteromedial buttock, measuring 8.1 x 5.5 cm transversely. There is no defined fluid collection to suggest an abscess. This all appears inflammatory. Musculoskeletal: No fracture or bone lesion. No bone resorption to suggest osteomyelitis. Stable changes from a previous lumbar spine fusion. IMPRESSION: 1. Focal area of inflammation in the subcutaneous soft tissues of the posteromedial right buttock, extending from the skin, measuring approximately 8.1 x 5.5 cm transversely. No evidence of an abscess. 2. No evidence of osteomyelitis. 3. No other acute abnormality. Electronically Signed   By: Amie Portland M.D.   On: 08/08/2021 17:08    Procedures Procedures    Medications Ordered in ED Medications  cefTRIAXone (ROCEPHIN) 2 g in sodium chloride 0.9 % 100 mL IVPB (0 g Intravenous Stopped 08/08/21 1655)  0.9 %  sodium chloride infusion (0 mLs Intravenous Stopped 08/08/21 1947)  0.9 %  sodium chloride infusion (has no administration in time range)  sodium chloride 0.9 % bolus 1,000 mL ( Intravenous Stopped 08/08/21 1905)  oxyCODONE-acetaminophen (PERCOCET/ROXICET) 5-325 MG per tablet 2 tablet (2 tablets Oral Given 08/08/21 1540)  iohexol (OMNIPAQUE) 300 MG/ML solution 100  mL (100 mLs Intravenous Contrast Given 08/08/21 1656)  vancomycin (VANCOCIN) IVPB 1000 mg/200 mL premix (0 mg Intravenous Stopped 08/08/21 1905)  oxyCODONE-acetaminophen (PERCOCET/ROXICET) 5-325 MG per tablet 1 tablet (1 tablet Oral Given 08/08/21 1843)    ED Course/ Medical Decision Making/ A&P                           Medical Decision Making Amount and/or Complexity of Data Reviewed Labs: ordered.   Kyle Rice is a 61 y.o. male presents to the ED for concern of abscess or wound to the buttock, this involves an extensive number of treatment options, and is a complaint that carries with it a high risk of complications and morbidity.  The differential diagnosis includes cellulitis, abscess, deeper space soft tissue infection, necrotizing fasciitis, sepsis, decubitus ulcer   Additional history obtained:  Additional history obtained from chart review External records from outside source obtained and reviewed including outside patient records and recent labs   Lab Tests:  I Ordered, reviewed, and interpreted labs.  The pertinent results include:  Leukocytosis of 20.5, stable hemoglobin, glucose 156, no other significant electrolyte derangements, normal renal and liver function and normal lactic acid.  Blood cultures pending   Imaging Studies ordered:  I ordered imaging studies including CT pelvis with contrast I independently visualized and interpreted imaging which showed focal inflammation of the soft tissues in the posterior medial right buttock, extending forward, 8.1 x 5.5 cm area of inflammation consistent with cellulitis but no evidence of an abscess.  No osteomyelitis or soft tissue gas. I agree with the radiologist interpretation   Cardiac Monitoring:  The patient was maintained on a cardiac monitor.  I personally viewed and interpreted the cardiac monitored which showed an underlying rhythm of: sinus tachycardia   Medicines ordered and prescription drug management:  I  ordered medication including symptoms IV fluid bolus, pain medication, IV vancomycin and Rocephin for skin infection and sepsis Reevaluation of the patient after these medicines showed that the patient improved I have reviewed the patients home medicines and have made adjustments as needed    ED Course:  Patient presents with concern for soft tissue infection, wound or abscess to the  right buttock that has been rapidly worsening over the past 4 days associated with subjective fever and systemic symptoms.  Patient with underlying immunocompromise due to immunologic therapy and chronic steroids for rheumatoid arthritis. Bedside ultrasound without obvious fluid collection but given large area of cellulitis tracking towards the perineum CT pelvis ordered, fortunately no abscess and no soft tissue gas.  But given that patient meets SIRS criteria with significant area of cellulitis recommended admission for continued IV antibiotics and close monitoring. I discussed patient's results and recommendations for continued care at length with the patient, patient is a retired Insurance underwriter and expresses understanding.  Patient reports that he has been expending a lot of time in the hospital, and after much thought reports that he would prefer to go home tonight and continue with oral antibiotics, but will plan to present to [facility] in the morning for potential admission or will follow-up with his PCP tomorrow. I offered to call [facility] tonight to see if they have any beds available for transfer and admission, but patient reports he would like to go home tonight.  I discussed with patient that I cannot rule out significant clinical decline and worsening and that he is at risk for worsening infection, worsening sepsis, with risk for significant morbidity and mortality.  He expresses understanding of this risk and is alert and oriented with decision-making capacity but does not wish to be  admitted at this time.   Patient completed doses of IV Rocephin and IV vancomycin and pain is well controlled.  Tachycardia improved with IV fluids.  Given that patient does not wish to be admitted at this time, will oral Keflex and Bactrim to be taken at home but I have encouraged patient to return to the emergency department at any time if he feels worse or changes his mind and would like to be admitted for continued treatment and close monitoring.  He expresses understanding.  Patient has decided to leave AGAINST MEDICAL ADVICE at this time.   Dispostion:  After consideration of the diagnostic results and the patients response to treatment feel that the patent would benefit from admission, but patient declines admission at this time and has signed out AGAINST MEDICAL ADVICE.          Final Clinical Impression(s) / ED Diagnoses Final diagnoses:  Cellulitis of buttock    Rx / DC Orders ED Discharge Orders          Ordered    cephALEXin (KEFLEX) 500 MG capsule  4 times daily        08/08/21 1847    sulfamethoxazole-trimethoprim (BACTRIM DS) 800-160 MG tablet  2 times daily        08/08/21 1847              Dartha Lodge, PA-C 08/09/21 Leanord Hawking    Alvira Monday, MD 08/09/21 1712

## 2021-08-08 NOTE — ED Notes (Signed)
Patient transported to CT 

## 2021-08-08 NOTE — ED Notes (Signed)
Patient left AMA  Alert and oriented x4

## 2021-08-08 NOTE — ED Triage Notes (Signed)
Pt arrives pov, to triage using walker, c/o pressure wound on right hip x 3 days.

## 2021-08-08 NOTE — ED Notes (Signed)
Working on getting a better IV, then Radiology is at the door, so will have to update vitals when he returns to room

## 2021-08-08 NOTE — ED Notes (Signed)
Patient states drainage started this am. Had for four days . Abcess on top of buttuck

## 2021-08-08 NOTE — ED Notes (Signed)
ED Provider at bedside. 

## 2021-08-08 NOTE — Discharge Instructions (Addendum)
It was recommended that you should be admitted to the hospital given extensive cellulitis noted on your CT scan, with elevated white blood cell count and elevated heart rate.  Especially given your underlying immunocompromise from your rheumatoid arthritis medications.  You can return to the hospital at any time for further evaluation and admission.  If you do return I recommend going to the hospital that you would like to be admitted to but we can certainly see you here again as well.  I have prescribed you oral antibiotics to take starting tonight to continue to treat infection while you are at home but I do encourage you to return to the hospital for admission and continued IV antibiotics.

## 2021-08-13 LAB — CULTURE, BLOOD (ROUTINE X 2)
Culture: NO GROWTH
Special Requests: ADEQUATE

## 2022-07-09 DEATH — deceased
